# Patient Record
Sex: Female | Born: 1980
Health system: Southern US, Community
[De-identification: ages and names within clinical notes are randomized; demographics above are authoritative.]

## PROBLEM LIST (undated history)

## (undated) ENCOUNTER — Emergency Department (HOSPITAL_COMMUNITY): Admission: EM | Payer: 59 | Source: Home / Self Care

## (undated) DIAGNOSIS — G459 Transient cerebral ischemic attack, unspecified: Secondary | ICD-10-CM

## (undated) HISTORY — DX: Transient cerebral ischemic attack, unspecified: G45.9

---

## 1999-10-23 ENCOUNTER — Encounter: Payer: Self-pay | Admitting: Family Medicine

## 1999-10-23 ENCOUNTER — Encounter: Admission: RE | Admit: 1999-10-23 | Discharge: 1999-10-23 | Payer: Self-pay | Admitting: Family Medicine

## 2000-03-17 ENCOUNTER — Encounter: Payer: Self-pay | Admitting: Family Medicine

## 2000-03-17 ENCOUNTER — Encounter: Admission: RE | Admit: 2000-03-17 | Discharge: 2000-03-17 | Payer: Self-pay | Admitting: Family Medicine

## 2000-12-21 HISTORY — PX: BREAST REDUCTION SURGERY: SHX8

## 2001-02-17 ENCOUNTER — Other Ambulatory Visit: Admission: RE | Admit: 2001-02-17 | Discharge: 2001-02-17 | Payer: Self-pay | Admitting: Gynecology

## 2001-03-31 ENCOUNTER — Other Ambulatory Visit: Admission: RE | Admit: 2001-03-31 | Discharge: 2001-03-31 | Payer: Self-pay | Admitting: Gynecology

## 2002-02-27 ENCOUNTER — Other Ambulatory Visit: Admission: RE | Admit: 2002-02-27 | Discharge: 2002-02-27 | Payer: Self-pay | Admitting: Gynecology

## 2003-03-26 ENCOUNTER — Other Ambulatory Visit: Admission: RE | Admit: 2003-03-26 | Discharge: 2003-03-26 | Payer: Self-pay | Admitting: Gynecology

## 2003-09-27 ENCOUNTER — Other Ambulatory Visit: Admission: RE | Admit: 2003-09-27 | Discharge: 2003-09-27 | Payer: Self-pay | Admitting: Gynecology

## 2004-04-17 ENCOUNTER — Other Ambulatory Visit: Admission: RE | Admit: 2004-04-17 | Discharge: 2004-04-17 | Payer: Self-pay | Admitting: Gynecology

## 2004-09-22 ENCOUNTER — Emergency Department (HOSPITAL_COMMUNITY): Admission: EM | Admit: 2004-09-22 | Discharge: 2004-09-22 | Payer: Self-pay | Admitting: Emergency Medicine

## 2005-04-28 ENCOUNTER — Other Ambulatory Visit: Admission: RE | Admit: 2005-04-28 | Discharge: 2005-04-28 | Payer: Self-pay | Admitting: Gynecology

## 2006-05-03 ENCOUNTER — Other Ambulatory Visit: Admission: RE | Admit: 2006-05-03 | Discharge: 2006-05-03 | Payer: Self-pay | Admitting: Gynecology

## 2006-12-01 ENCOUNTER — Inpatient Hospital Stay (HOSPITAL_COMMUNITY): Admission: AD | Admit: 2006-12-01 | Discharge: 2006-12-01 | Payer: Self-pay | Admitting: Gynecology

## 2007-05-14 ENCOUNTER — Inpatient Hospital Stay (HOSPITAL_COMMUNITY): Admission: AD | Admit: 2007-05-14 | Discharge: 2007-05-14 | Payer: Self-pay | Admitting: Obstetrics and Gynecology

## 2007-05-17 ENCOUNTER — Inpatient Hospital Stay (HOSPITAL_COMMUNITY): Admission: AD | Admit: 2007-05-17 | Discharge: 2007-05-17 | Payer: Self-pay | Admitting: Obstetrics and Gynecology

## 2007-08-02 ENCOUNTER — Inpatient Hospital Stay (HOSPITAL_COMMUNITY): Admission: AD | Admit: 2007-08-02 | Discharge: 2007-08-05 | Payer: Self-pay | Admitting: Obstetrics and Gynecology

## 2011-03-23 ENCOUNTER — Emergency Department (HOSPITAL_COMMUNITY): Payer: 59

## 2011-03-23 ENCOUNTER — Observation Stay (HOSPITAL_COMMUNITY)
Admission: EM | Admit: 2011-03-23 | Discharge: 2011-03-23 | Disposition: A | Discharge status: Home / Self Care | Payer: 59 | Attending: Emergency Medicine | Admitting: Emergency Medicine

## 2011-03-23 DIAGNOSIS — G459 Transient cerebral ischemic attack, unspecified: Principal | ICD-10-CM | POA: Insufficient documentation

## 2011-03-23 DIAGNOSIS — H5310 Unspecified subjective visual disturbances: Secondary | ICD-10-CM

## 2011-03-23 LAB — CBC
HCT: 37.7 % (ref 36.0–46.0)
Hemoglobin: 13.2 g/dL (ref 12.0–15.0)
MCH: 31.4 pg (ref 26.0–34.0)
MCHC: 35 g/dL (ref 30.0–36.0)
MCV: 89.8 fL (ref 78.0–100.0)
Platelets: 216 10*3/uL (ref 150–400)
RBC: 4.2 MIL/uL (ref 3.87–5.11)
RDW: 12.2 % (ref 11.5–15.5)
WBC: 14.1 10*3/uL — ABNORMAL HIGH (ref 4.0–10.5)

## 2011-03-23 LAB — PROTIME-INR
INR: 1.06 (ref 0.00–1.49)
Prothrombin Time: 14 seconds (ref 11.6–15.2)

## 2011-03-23 LAB — COMPREHENSIVE METABOLIC PANEL
ALT: 17 U/L (ref 0–35)
AST: 20 U/L (ref 0–37)
Albumin: 3.8 g/dL (ref 3.5–5.2)
Alkaline Phosphatase: 41 U/L (ref 39–117)
BUN: 13 mg/dL (ref 6–23)
CO2: 21 mEq/L (ref 19–32)
Calcium: 8.9 mg/dL (ref 8.4–10.5)
Chloride: 107 mEq/L (ref 96–112)
Creatinine, Ser: 0.61 mg/dL (ref 0.4–1.2)
GFR calc Af Amer: >60 mL/min (ref >60)
GFR calc non Af Amer: >60 mL/min (ref >60)
Glucose, Bld: 80 mg/dL (ref 70–99)
Potassium: 3.4 mEq/L — ABNORMAL LOW (ref 3.5–5.1)
Sodium: 138 mEq/L (ref 135–145)
Total Bilirubin: 0.7 mg/dL (ref 0.3–1.2)
Total Protein: 6.9 g/dL (ref 6.0–8.3)

## 2011-03-23 LAB — URINALYSIS, ROUTINE W REFLEX MICROSCOPIC
Bilirubin Urine: NEGATIVE
Glucose, UA: NEGATIVE mg/dL
Hgb urine dipstick: NEGATIVE
Ketones, ur: NEGATIVE mg/dL
Nitrite: NEGATIVE
Protein, ur: NEGATIVE mg/dL
Specific Gravity, Urine: 1.01 (ref 1.005–1.030)
Urobilinogen, UA: 0.2 mg/dL (ref 0.0–1.0)
pH: 6 (ref 5.0–8.0)

## 2011-03-23 LAB — APTT: aPTT: 26 seconds (ref 24–37)

## 2011-03-23 LAB — RAPID URINE DRUG SCREEN, HOSP PERFORMED
Amphetamines: NOT DETECTED
Barbiturates: NOT DETECTED
Benzodiazepines: NOT DETECTED
Cocaine: NOT DETECTED
Opiates: NOT DETECTED
Tetrahydrocannabinol: NOT DETECTED

## 2011-03-23 LAB — ETHANOL: Alcohol, Ethyl (B): <5 mg/dL (ref 0–10)

## 2011-03-23 LAB — POCT CARDIAC MARKERS
CKMB, poc: <1 ng/mL — ABNORMAL LOW (ref 1.0–8.0)
Myoglobin, poc: 30.8 ng/mL (ref 12–200)
Troponin i, poc: <0.05 ng/mL (ref 0.00–0.09)

## 2011-03-23 LAB — HEMOGLOBIN A1C
Hgb A1c MFr Bld: 5.1 % (ref <5.7)
Mean Plasma Glucose: 100 mg/dL (ref <117)

## 2011-03-23 LAB — PREGNANCY, URINE: Preg Test, Ur: NEGATIVE

## 2011-04-08 ENCOUNTER — Other Ambulatory Visit: Payer: Self-pay | Admitting: Diagnostic Neuroimaging

## 2011-04-08 DIAGNOSIS — R51 Headache: Secondary | ICD-10-CM

## 2011-04-08 DIAGNOSIS — H5310 Unspecified subjective visual disturbances: Secondary | ICD-10-CM

## 2011-04-08 DIAGNOSIS — G459 Transient cerebral ischemic attack, unspecified: Secondary | ICD-10-CM

## 2011-04-08 DIAGNOSIS — R259 Unspecified abnormal involuntary movements: Secondary | ICD-10-CM

## 2011-04-10 ENCOUNTER — Encounter (HOSPITAL_BASED_OUTPATIENT_CLINIC_OR_DEPARTMENT_OTHER): Payer: 59 | Admitting: Oncology

## 2011-04-10 ENCOUNTER — Other Ambulatory Visit: Payer: Self-pay | Admitting: Oncology

## 2011-04-10 DIAGNOSIS — I749 Embolism and thrombosis of unspecified artery: Secondary | ICD-10-CM

## 2011-04-10 DIAGNOSIS — D6859 Other primary thrombophilia: Secondary | ICD-10-CM

## 2011-04-13 LAB — PROTEIN S, ANTIGEN, FREE: Protein S Ag, Free: 82 % normal (ref 50–147)

## 2011-04-13 LAB — PROTEIN C ACTIVITY: Protein C Activity: 130 % (ref 75–133)

## 2011-04-13 LAB — PROTEIN S ACTIVITY: Protein S Activity: 72 % (ref 69–129)

## 2011-04-13 LAB — PROTEIN C, TOTAL: Protein C, Total: 88 % (ref 72–160)

## 2011-04-13 LAB — PROTEIN S, TOTAL: Protein S Ag, Total: 78 % (ref 60–150)

## 2011-04-17 ENCOUNTER — Ambulatory Visit
Admission: RE | Admit: 2011-04-17 | Discharge: 2011-04-17 | Disposition: A | Discharge status: Home / Self Care | Payer: 59 | Source: Ambulatory Visit | Attending: Diagnostic Neuroimaging | Admitting: Diagnostic Neuroimaging

## 2011-04-17 DIAGNOSIS — G459 Transient cerebral ischemic attack, unspecified: Secondary | ICD-10-CM

## 2011-04-17 DIAGNOSIS — H5310 Unspecified subjective visual disturbances: Secondary | ICD-10-CM

## 2011-04-17 DIAGNOSIS — R259 Unspecified abnormal involuntary movements: Secondary | ICD-10-CM

## 2011-04-17 DIAGNOSIS — R51 Headache: Secondary | ICD-10-CM

## 2011-04-17 MED ORDER — IOHEXOL 350 MG/ML SOLN
100.0000 mL | Freq: Once | INTRAVENOUS | Status: AC | PRN
  Administered 2011-04-17: 100 mL via INTRAVENOUS

## 2011-05-05 NOTE — Discharge Summary (Signed)
Kristen Gould, Kristen Gould             ACCOUNT NO.:  [id number]   MEDICAL RECORD NO.:  [id number]          PATIENT TYPE:  INP   LOCATION:  9119                          FACILITY:  [facility]   PHYSICIAN:  Kristen Gould, M.D. DATE OF BIRTH:  October 11, 1981   DATE OF ADMISSION:  08/02/2007  DATE OF DISCHARGE:  08/05/2007                               DISCHARGE SUMMARY   DISCHARGE DIAGNOSES:  1. Term pregnancy at 39+ weeks, delivered.  2. Status post low transverse cesarean section with double-layer      closure of the uterus.  3. Nonreassuring fetal heart tracing with fetal intolerance of labor.   DISCHARGE MEDICATIONS:  1. Motrin 600 mg p.o. every 6 hours.  2. Percocet one to two tablets p.o. every 4 hours p.r.n.   DISCHARGE FOLLOWUP:  The patient is to follow up in the office in 2  weeks for her incision check and again in 6 weeks for her full  postpartum exam.   HOSPITAL COURSE:  The patient is a 30 year old G1, P0 who was admitted  at 53 and five-sevenths weeks gestation after she presented to labor and  delivery with rupture of membranes.  She had no significant contractions  and thus was begun on Pitocin augmentation.  Her prenatal care been  complicated by positive group B strep status.  She did have a marginal  placenta previa; however, this resolved on followup scan.  Prenatal labs  are as follows:  A negative, antibody negative, RPR nonreactive, rubella  immune, hepatitis B surface antigen negative, HIV negative, GC negative,  chlamydia negative, group B strep positive, cystic fibrosis negative, 1-  hour Glucola 129.  Past OB history:  None.  Past GYN history:  In 2003  she had colposcopy which was normal.  Past surgical history:  Breast  reduction in 2001.  Past medical history:  Remote history of depression,  on no medications currently.  Allergies:  None.  Medications:  Prenatal  vitamins.   On admission she was afebrile with stable vital signs.  Fetal heart rate  was reactive.   Cervix was 75, 1+, at a -2 station and grossly ruptured.  She had been placed on penicillin for her positive group B strep status  and also on Pitocin.  She progressed throughout the day and eventually  reached 6 cm of dilation.  At this point she began to have intermittent  variable decelerations down to approximately 80 twice.  These lasted 45  minutes in duration and resolved with oxygen, repositioning, and the  discontinuation of her Pitocin.  We were assessing to determine whether  her Pitocin could be restarted and she had a third significant  deceleration to the 80s for approximately 6-7 minutes with very slow  recovery.  At this point the cervix was 90, 6, and a 0 station and the  patient was not near delivery.  The Pitocin was off and contractions  were very mild.  It was felt that since the baby was not tolerating even  labor without significant contractions that it would be best to proceed  with cesarean section, given the nonreassuring fetal tracing  and fetal  intolerance of labor.  Risks and benefits were discussed with the  patient in detail and she agreed to proceed.  She underwent a low  transverse C-section and was delivered of a viable female infant.  There  was a nuchal cord x1.  Weight was 7 pounds 2 ounces, Apgars were 8 and  9, and the remainder of the surgery went without difficulty.  She was  then admitted for routine postoperative care and did quite well.  Postoperative day #1 her hemoglobin was 11.9.  She was working on breast-  feeding and having some difficulty, given her history of a breast  reduction; however, was continuing this with supplementation on  discharge.  Upon discharge she was afebrile  with stable vital signs.  Her fundus was firm.  Her incision was clear  with no erythema, and her staples were removed and Steri-Strips placed.  She was given prescriptions for Motrin and Percocet and advised to  follow up in 2 weeks and instructed on pelvic  rest.      Kristen Gould, M.D.  Electronically Signed     KR/MEDQ  D:  08/05/2007  T:  08/05/2007  Job:  [id number]

## 2011-05-05 NOTE — Op Note (Signed)
Kristen Gould, JERNBERG             ACCOUNT NO.:  [id number]   MEDICAL RECORD NO.:  [id number]          PATIENT TYPE:  INP   LOCATION:  9119                          FACILITY:  [facility]   PHYSICIAN:  Huel Cote, M.D. DATE OF BIRTH:  01-Jul-1981   DATE OF PROCEDURE:  08/02/2007  DATE OF DISCHARGE:                               OPERATIVE REPORT   PREOPERATIVE DIAGNOSIS:  1. Term pregnancy at 39+ weeks.  2. Nonreassuring fetal heart rate tracing with fetal intolerance of      labor.   POSTOPERATIVE DIAGNOSIS:  1. Term pregnancy at 39+ weeks.  2. Nonreassuring fetal heart rate tracing with fetal intolerance of      labor.  3. OP presentation and a nuchal cord x1.   PROCEDURE:  Primary low transverse C-section with double layer closure  of uterus.   SURGEON:  Dr. Huel Cote   ANESTHESIA:  Epidural.   SPECIMENS:  Placenta was sent to L&D after cord blood donation.   ESTIMATED BLOOD LOSS:  800 mL.   IV FLUIDS:  1500 mL.   URINE OUTPUT:  100 mL clear urine.   FINDINGS:  There is a vigorous female infant in vertex OP presentation.  Nuchal cord x1 was reduced at the time of delivery of the head.  Apgars  were 08/09, weight was 7 pounds 2 ounces.  Placenta, ovaries and tubes  were normal.   PROCEDURE:  The patient was taken to the operating room where epidural  anesthesia was found to be adequate by Allis clamp test with a Foley  catheter in place.  She was then prepped and draped normal sterile  fashion in dorsal supine position with a leftward tilt.  A  Pfannenstiel's skin incision was then made through the skin and carried  through to underlying layer of fascia by sharp dissection and Bovie  cautery.  The fascia was then nicked in midline and the incision was  extended laterally with Mayo scissors.  The inferior aspect of the  incision was then grasped with Kocher clamps, elevated and dissected off  the underlying rectus muscles. Superior aspect was then elevated and  dissected off the rectus muscles.  The rectus muscles were separated in  midline and the peritoneum was entered bluntly.  Peritoneal incision was  then extended both superiorly, inferiorly with careful attention to  avoid both bowel bladder. The Alexis self-retaining wound retractor was  then placed within the incision and good exposure obtained of the lower  uterine segment.  This was then incised and the bladder flap created and  the remaining tissue incised in transverse fashion and the lower segment  and the cavity itself entered bluntly.  This was then extended bluntly  and the infant's head was delivered atraumatically. Nose and mouth were  bulb suctioned and a nuchal cord was reduced over the infant's head.  The remainder of the body delivered without difficulty.  The cord was  clamped, cut and the infant was handed to the waiting pediatricians.  The placenta was then expressed spontaneously and handed off for cord  blood donation.  The uterus was cleared of all  clots, debris with moist  lap sponge.  The uterine incision was then closed in a routine fashion.  The first layer running locked layer of 0 chromic.  The second an  imbricating layer of the same suture. Good hemostasis was noted.  The  pelvis and gutters were irrigated and cleared of all clots and debris  and no active bleeding was noted.  Therefore all instruments and sponges  were removed from the patient's abdomen.  The subfascial planes were  inspected and found to be normal.  The rectus muscles were  reapproximated with several interrupted mattress sutures of 0 Vicryl and  the fascia was closed with 0 Vicryl in a running fashion and the skin  was then closed with staples.  Sponge, lap and needle counts were  correct and the patient was taken to the recovery room in stable  condition.  The baby was taken to the regular newborn nursery.      Huel Cote, M.D.  Electronically Signed     KR/MEDQ  D:   08/02/2007  T:  08/03/2007  Job:  [id number]

## 2011-10-05 LAB — RH IMMUNE GLOB WKUP(>/=20WKS)(NOT WOMEN'S HOSP): Fetal Screen: NEGATIVE

## 2011-10-05 LAB — RPR: RPR Ser Ql: NONREACTIVE

## 2011-10-05 LAB — CBC
HCT: 35.3 — ABNORMAL LOW
HCT: 37.8
Hemoglobin: 11.9 — ABNORMAL LOW
Hemoglobin: 13.2
MCHC: 33.8
MCHC: 35
MCV: 93.4
MCV: 94.6
Platelets: 156
Platelets: 171
RBC: 3.73 — ABNORMAL LOW
RBC: 4.05
RDW: 12.9
RDW: 13
WBC: 14.6 — ABNORMAL HIGH
WBC: 18.3 — ABNORMAL HIGH

## 2011-10-05 LAB — CCBB MATERNAL DONOR DRAW

## 2014-11-23 ENCOUNTER — Other Ambulatory Visit: Payer: Self-pay

## 2014-11-23 ENCOUNTER — Emergency Department (HOSPITAL_COMMUNITY)
Admission: EM | Admit: 2014-11-23 | Discharge: 2014-11-23 | Disposition: A | Discharge status: Home / Self Care | Payer: Managed Care, Other (non HMO) | Attending: Emergency Medicine | Admitting: Emergency Medicine

## 2014-11-23 ENCOUNTER — Emergency Department (HOSPITAL_COMMUNITY): Payer: Managed Care, Other (non HMO)

## 2014-11-23 ENCOUNTER — Encounter (HOSPITAL_COMMUNITY): Payer: Self-pay

## 2014-11-23 DIAGNOSIS — G44219 Episodic tension-type headache, not intractable: Secondary | ICD-10-CM

## 2014-11-23 DIAGNOSIS — R519 Headache, unspecified: Secondary | ICD-10-CM

## 2014-11-23 DIAGNOSIS — Z87891 Personal history of nicotine dependence: Secondary | ICD-10-CM | POA: Diagnosis not present

## 2014-11-23 DIAGNOSIS — R51 Headache: Secondary | ICD-10-CM

## 2014-11-23 DIAGNOSIS — G930 Cerebral cysts: Secondary | ICD-10-CM | POA: Diagnosis not present

## 2014-11-23 DIAGNOSIS — Z7982 Long term (current) use of aspirin: Secondary | ICD-10-CM | POA: Diagnosis not present

## 2014-11-23 DIAGNOSIS — Z3202 Encounter for pregnancy test, result negative: Secondary | ICD-10-CM | POA: Diagnosis not present

## 2014-11-23 DIAGNOSIS — Z8673 Personal history of transient ischemic attack (TIA), and cerebral infarction without residual deficits: Secondary | ICD-10-CM | POA: Insufficient documentation

## 2014-11-23 LAB — URINALYSIS, ROUTINE W REFLEX MICROSCOPIC
Bilirubin Urine: NEGATIVE
Glucose, UA: NEGATIVE mg/dL
Ketones, ur: NEGATIVE mg/dL
Leukocytes, UA: NEGATIVE
Nitrite: NEGATIVE
Protein, ur: NEGATIVE mg/dL
Specific Gravity, Urine: 1.009 (ref 1.005–1.030)
Urobilinogen, UA: 0.2 mg/dL (ref 0.0–1.0)
pH: 5.5 (ref 5.0–8.0)

## 2014-11-23 LAB — RAPID URINE DRUG SCREEN, HOSP PERFORMED
Amphetamines: NOT DETECTED
Barbiturates: NOT DETECTED
Benzodiazepines: NOT DETECTED
Cocaine: NOT DETECTED
Opiates: NOT DETECTED
Tetrahydrocannabinol: NOT DETECTED

## 2014-11-23 LAB — COMPREHENSIVE METABOLIC PANEL
ALT: 21 U/L (ref 0–35)
AST: 21 U/L (ref 0–37)
Albumin: 4.4 g/dL (ref 3.5–5.2)
Alkaline Phosphatase: 43 U/L (ref 39–117)
Anion gap: 14 (ref 5–15)
BUN: 13 mg/dL (ref 6–23)
CO2: 23 mEq/L (ref 19–32)
Calcium: 9.4 mg/dL (ref 8.4–10.5)
Chloride: 103 mEq/L (ref 96–112)
Creatinine, Ser: 0.63 mg/dL (ref 0.50–1.10)
GFR calc Af Amer: >90 mL/min (ref >90)
GFR calc non Af Amer: >90 mL/min (ref >90)
Glucose, Bld: 93 mg/dL (ref 70–99)
Potassium: 4 mEq/L (ref 3.7–5.3)
Sodium: 140 mEq/L (ref 137–147)
Total Bilirubin: 0.5 mg/dL (ref 0.3–1.2)
Total Protein: 7.6 g/dL (ref 6.0–8.3)

## 2014-11-23 LAB — CBC
HCT: 38.6 % (ref 36.0–46.0)
Hemoglobin: 13.5 g/dL (ref 12.0–15.0)
MCH: 31.8 pg (ref 26.0–34.0)
MCHC: 35 g/dL (ref 30.0–36.0)
MCV: 90.8 fL (ref 78.0–100.0)
Platelets: 218 10*3/uL (ref 150–400)
RBC: 4.25 MIL/uL (ref 3.87–5.11)
RDW: 12.2 % (ref 11.5–15.5)
WBC: 8.6 10*3/uL (ref 4.0–10.5)

## 2014-11-23 LAB — DIFFERENTIAL
Basophils Absolute: 0 10*3/uL (ref 0.0–0.1)
Basophils Relative: 0 % (ref 0–1)
Eosinophils Absolute: 0 10*3/uL (ref 0.0–0.7)
Eosinophils Relative: 1 % (ref 0–5)
Lymphocytes Relative: 27 % (ref 12–46)
Lymphs Abs: 2.3 10*3/uL (ref 0.7–4.0)
Monocytes Absolute: 0.5 10*3/uL (ref 0.1–1.0)
Monocytes Relative: 6 % (ref 3–12)
Neutro Abs: 5.7 10*3/uL (ref 1.7–7.7)
Neutrophils Relative %: 66 % (ref 43–77)

## 2014-11-23 LAB — POC URINE PREG, ED: Preg Test, Ur: NEGATIVE

## 2014-11-23 LAB — URINE MICROSCOPIC-ADD ON

## 2014-11-23 LAB — APTT: aPTT: 29 seconds (ref 24–37)

## 2014-11-23 LAB — PROTIME-INR
INR: 1.11 (ref 0.00–1.49)
Prothrombin Time: 14.4 seconds (ref 11.6–15.2)

## 2014-11-23 MED ORDER — IBUPROFEN 600 MG PO TABS: 600.0000 mg | ORAL_TABLET | Freq: Four times a day (QID) | ORAL | Status: AC | PRN

## 2014-11-23 MED ORDER — METOCLOPRAMIDE HCL 5 MG/ML IJ SOLN: 10.0000 mg | Freq: Once | INTRAMUSCULAR | Status: DC

## 2014-11-23 MED ORDER — DIPHENHYDRAMINE HCL 50 MG/ML IJ SOLN: 12.5000 mg | Freq: Once | INTRAMUSCULAR | Status: DC

## 2014-11-23 MED ORDER — SODIUM CHLORIDE 0.9 % IV BOLUS (SEPSIS)
1000.0000 mL | Freq: Once | INTRAVENOUS | Status: AC
  Administered 2014-11-23: 1000 mL via INTRAVENOUS

## 2014-11-23 NOTE — ED Notes (Signed)
Pt reports intermittent L head numbness and pain ongoing x 2 weeks.  Pt denies symptoms currently.  Neurologically intact.

## 2014-11-23 NOTE — Consult Note (Signed)
Consult Reason for Consult: headache Referring Physician: Dr Blinda Leatherwood [facility] ED  CC: headache  HPI: Kristen Gould is an 33 y.o. female PMHx of questionable TIA approximately 4 years ago presenting to the ED with headache that has been ongoing intermittent for the past 2-3 weeks. Patient reported that the pain is localized to the left side of the face described as a dull, aching sensation with radiation to the left side of the neck. Patient reported that the pain comes and goes - reported that when the pain occurs it is rather intense (at worse is a 7-8/10). Currently 4 to 5/10. Came to ED today due to prolonged duration of the symptoms.  Reported that she has been having increased nausea today, but denied vomiting. Reported that she has been having increased dizziness this morning with feeling unsteady with walking. This has resolved. No focal motor or sensory changes. No gait instability. Has not taken anything for her headaches. Stated that she does not have history of migraines.    Past Medical History  Diagnosis Date  . TIA (transient ischemic attack)     Past Surgical History  Procedure Laterality Date  . Breast reduction surgery  2002  . Cesarean section  2008    Family History  Problem Relation Age of Onset  . Hypertension Father   . Heart attack Maternal Grandfather 70  . Heart attack Paternal Grandfather 61    Social History:  reports that she quit smoking about 2 years ago. She does not have any smokeless tobacco history on file. She reports that she drinks alcohol. She reports that she does not use illicit drugs.  No Known Allergies  Medications: I have reviewed the patient's current medications. Scheduled:  CT head imaging reviewed and is unremarkable.  ROS: Out of a complete 14 system review, the patient complains of only the following symptoms, and all other reviewed systems are negative. + headache, anxiety  Physical Examination: Filed Vitals:   11/23/14 1445   BP: 113/61  Pulse: 77  Temp:   Resp: 22   Physical Exam  Constitutional: He appears well-developed and well-nourished.  Psych: Affect appropriate to situation Eyes: No scleral injection HENT: No OP obstrucion Head: Normocephalic.  Cardiovascular: Normal rate and regular rhythm.  Respiratory: Effort normal and breath sounds normal.  GI: Soft. Bowel sounds are normal. No distension. There is no tenderness.  Skin: WDI  Neurologic Examination Mental Status: Alert, oriented, thought content appropriate.  Speech fluent without evidence of aphasia.  Able to follow 3 step commands without difficulty. Cranial Nerves: II: funduscopic exam wnl bilaterally, visual fields grossly normal, pupils equal, round, reactive to light and accommodation III,IV, VI: ptosis not present, extra-ocular motions intact bilaterally V,VII: smile symmetric, facial light touch sensation normal bilaterally VIII: hearing normal bilaterally IX,X: gag reflex present XI: trapezius strength/neck flexion strength normal bilaterally XII: tongue strength normal  Motor: Right : Upper extremity    Left:     Upper extremity 5/5 deltoid       5/5 deltoid 5/5 biceps      5/5 biceps  5/5 triceps      5/5 triceps 5/5 hand grip      5/5 hand grip  Lower extremity     Lower extremity 5/5 hip flexor      5/5 hip flexor 5/5 quadricep      5/5 quadriceps  5/5 hamstrings     5/5 hamstrings 5/5 plantar flexion       5/5 plantar flexion 5/5 plantar  extension     5/5 plantar extension Tone and bulk:normal tone throughout; no atrophy noted Sensory: Pinprick and light touch intact throughout, bilaterally Deep Tendon Reflexes: 2+ and symmetric throughout Plantars: Right: downgoing   Left: downgoing Cerebellar: normal finger-to-nose, normal heel-to-shin test Gait: normal gait and station  Laboratory Studies:   Basic Metabolic Panel:  Recent Labs Lab 11/23/14 1410  NA 140  K 4.0  CL 103  CO2 23  GLUCOSE 93  BUN 13   CREATININE 0.63  CALCIUM 9.4    Liver Function Tests:  Recent Labs Lab 11/23/14 1410  AST 21  ALT 21  ALKPHOS 43  BILITOT 0.5  PROT 7.6  ALBUMIN 4.4   No results for input(s): LIPASE, AMYLASE in the last 168 hours. No results for input(s): AMMONIA in the last 168 hours.  CBC:  Recent Labs Lab 11/23/14 1410  WBC 8.6  NEUTROABS 5.7  HGB 13.5  HCT 38.6  MCV 90.8  PLT 218    Cardiac Enzymes: No results for input(s): CKTOTAL, CKMB, CKMBINDEX, TROPONINI in the last 168 hours.  BNP: Invalid input(s): POCBNP  CBG: No results for input(s): GLUCAP in the last 168 hours.  Microbiology: No results found for this or any previous visit.  Coagulation Studies:  Recent Labs  11/23/14 1410  LABPROT 14.4  INR 1.11    Urinalysis: No results for input(s): COLORURINE, LABSPEC, PHURINE, GLUCOSEU, HGBUR, BILIRUBINUR, KETONESUR, PROTEINUR, UROBILINOGEN, NITRITE, LEUKOCYTESUR in the last 168 hours.  Invalid input(s): APPERANCEUR  Lipid Panel:  No results found for: CHOL, TRIG, HDL, CHOLHDL, VLDL, LDLCALC  HgbA1C:  Lab Results  Component Value Date   HGBA1C  03/23/2011    5.1 (NOTE)                                                                       According to the ADA Clinical Practice Recommendations for 2011, when HbA1c is used as a screening test:   >=6.5%   Diagnostic of Diabetes Mellitus           (if abnormal result  is confirmed)  5.7-6.4%   Increased risk of developing Diabetes Mellitus  References:Diagnosis and Classification of Diabetes Mellitus,Diabetes Care,2011,34(Suppl 1):S62-S69 and Standards of Medical Care in         Diabetes - 2011,Diabetes Care,2011,34  (Suppl 1):S11-S61.    Urine Drug Screen:     Component Value Date/Time   LABOPIA NONE DETECTED 03/23/2011 1417   COCAINSCRNUR NONE DETECTED 03/23/2011 1417   LABBENZ NONE DETECTED 03/23/2011 1417   AMPHETMU NONE DETECTED 03/23/2011 1417   THCU NONE DETECTED 03/23/2011 1417   LABBARB   03/23/2011 1417    NONE DETECTED        DRUG SCREEN FOR MEDICAL PURPOSES ONLY.  IF CONFIRMATION IS NEEDED FOR ANY PURPOSE, NOTIFY LAB WITHIN 5 DAYS.        LOWEST DETECTABLE LIMITS FOR URINE DRUG SCREEN Drug Class       Cutoff (ng/mL) Amphetamine      1000 Barbiturate      200 Benzodiazepine   200 Tricyclics       300 Opiates          300 Cocaine          300 THC  50    Alcohol Level: No results for input(s): ETH in the last 168 hours.  Other results:  Imaging: Ct Head (brain) Wo Contrast  11/23/2014   CLINICAL DATA:  Two week history of left-sided headache. Intermittent dizziness and numbness  EXAM: CT HEAD WITHOUT CONTRAST  TECHNIQUE: Contiguous axial images were obtained from the base of the skull through the vertex without intravenous contrast.  COMPARISON:  March 23, 2011  FINDINGS: The ventricles are normal in size and configuration. There is no mass, hemorrhage, extra-axial fluid collection, or midline shift. Gray-white compartments are normal. No acute infarct apparent. The bony calvarium appears intact. The mastoid air cells are clear.  IMPRESSION: Study within normal limits.   Electronically Signed   By: Bretta BangWilliam  Woodruff M.D.   On: 11/23/2014 14:34     Assessment/Plan:  33y/o woman presenting for evaluation of 2-3 weeks of fluctuating headache. Presents today for evaluation due to increased intensity and duration. Currently non-focal exam, appears comfortable. Discussed treatment options. She does not want any IV therapy at this time. Discussed using ibuprofen as needed for outpatient symptomatic relief.  -due to prolonged duration and prior history will check MRI brain. If unremarkable no further workup at this time -ibuprofen as needed for symptomatic relief -can follow up with outpatient neurology if headache persists  Kristen Choeter Ashleyanne Hemmingway, DO [facility] [phone]  If 7pm- 7am, please page neurology on call as listed in AMION. 11/23/2014, 4:06  PM

## 2014-11-23 NOTE — ED Provider Notes (Signed)
7:48 PM [facility], please see their note for full history, physical and decision making until this point. In brief this is a 33 y.o. year old female who presented to the ED tonight with Numbness and Headache     H/O TIA, now with a couple weeks of headache and numbness. Labs adn CT unremarkable. Neuro saw and if MRI normal can d/c.   MRI with enlarging arachnoid cyst. Neurology updated and suggested ibuprofen and outpatient follow up. Will have her follow up with Neurosurgery.   Discharge instructions, including strict return precautions for new or worsening symptoms, given. Patient and/or family verbalized understanding and agreement with the plan as described.   Labs, studies and imaging reviewed by myself and considered in medical decision making if ordered. Imaging interpreted by radiology. Pt was discussed with my attending, Dr. Silverio LayYao.  Labs Reviewed  URINALYSIS, ROUTINE W REFLEX MICROSCOPIC - Abnormal; Notable for the following:    Hgb urine dipstick TRACE (*)    All other components within normal limits  URINE MICROSCOPIC-ADD ON - Abnormal; Notable for the following:    Bacteria, UA FEW (*)    All other components within normal limits  PROTIME-INR  APTT  CBC  DIFFERENTIAL  COMPREHENSIVE METABOLIC PANEL  URINE RAPID DRUG SCREEN (HOSP PERFORMED)  I-STAT TROPOININ, ED  POC URINE PREG, ED     New Prescriptions   IBUPROFEN (ADVIL,MOTRIN) 600 MG TABLET    Take 1 tablet (600 mg total) by mouth every 6 (six) hours as needed.     Marily MemosJason Ezana Hubbert, MD 11/24/14 0126  Richardean Canalavid H Yao, MD 11/24/14 [phone]

## 2014-11-23 NOTE — ED Provider Notes (Signed)
CSN: [id number]     Arrival date & time 11/23/14  1319 History   First MD Initiated Contact with Patient 11/23/14 1418     Chief Complaint  Patient presents with  . Numbness  . Headache     (Consider location/radiation/quality/duration/timing/severity/associated sxs/prior Treatment) The history is provided by the patient. No language interpreter was used.  Kristen Gould is a 33 y/o F with PMHx of TIA approximately 4 years ago presenting to the ED with headache that has been ongoing intermittent for the past 2-3 weeks. Patient reported that the pain is localized to the left side of the face described as a dull, aching sensation with radiation to the left side of the neck. Patient reported that the pain comes and goes - reported that when the pain occurs it is rather intense. Stated that the pain is different today - reported that the pain has been constant today since this morning while at work. Reported that when she moves her neck she has a pulling sensation. Reported that she has been having increased nausea today, but denied vomiting. Reported that she has been having increased dizziness this morning with feeling unsteady with walking. Stated that she does not have history of migraines. Patient reported that she has not been using anything for pain. Denied fall, head injury, syncope, vomiting, fever, chills, neck pain, neck stiffness, blurred vision, sudden loss of vision, confusion, disorientation, difficulty swallowing, chest pain, shortness of breath, difficulty breathing, weakness. Denied birth control or estrogen use. Reported that she is supposed to be taking AS 81 mg daily after her TIA which she has not been doing.  PCP none  Past Medical History  Diagnosis Date  . TIA (transient ischemic attack)    Past Surgical History  Procedure Laterality Date  . Breast reduction surgery  2002  . Cesarean section  2008   Family History  Problem Relation Age of Onset  . Hypertension Father    . Heart attack Maternal Grandfather 70  . Heart attack Paternal Grandfather [id number]   History  Substance Use Topics  . Smoking status: Former Smoker    Quit date: 11/23/2012  . Smokeless tobacco: Not on file  . Alcohol Use: Yes   OB History    No data available     Review of Systems  Constitutional: Negative for fever and chills.  Eyes: Negative for visual disturbance.  Respiratory: Negative for chest tightness and shortness of breath.   Cardiovascular: Negative for chest pain.  Gastrointestinal: Positive for nausea. Negative for vomiting and abdominal pain.  Musculoskeletal: Negative for back pain, neck pain and neck stiffness.  Neurological: Positive for dizziness and headaches. Negative for seizures, speech difficulty, weakness and numbness.      Allergies  Review of patient's allergies indicates no known allergies.  Home Medications   Prior to Admission medications   Medication Sig Start Date End Date Taking? Authorizing Provider  aspirin 81 MG tablet Take 81 mg by mouth daily. Aspirin 81 MG Tablet Delayed Release 1 tablet Once a day    Historical Provider, MD  IBUPROFEN PO Take by mouth. Ibuprofen 200 MG Tablet 2 tablets as needed    Historical Provider, MD   BP 121/74 mmHg  Pulse 82  Temp(Src) 98.4 F (36.9 C) (Oral)  Resp 20  Ht 5\' 3"  (1.6 m)  Wt 110 lb (49.896 kg)  BMI 19.49 kg/m2  SpO2 100%  LMP 11/09/2014 Physical Exam  Constitutional: She is oriented to person, place, and time. She appears  well-developed and well-nourished. No distress.  HENT:  Head: Normocephalic and atraumatic.  Mouth/Throat: Oropharynx is clear and moist. No oropharyngeal exudate.  Negative pain upon palpation to the temporal regions Negative signs of trauma to the skull  Eyes: Conjunctivae and EOM are normal. Pupils are equal, round, and reactive to light. Right eye exhibits no discharge. Left eye exhibits no discharge.  Mild horizontal nystagmus that is resolvable  Neck: Normal  range of motion. Neck supple. No tracheal deviation present.  Negative neck stiffness Negative nuchal rigidity  Negative cervical lymphadenopathy  Negative meningeal signs   Cardiovascular: Normal rate, regular rhythm and normal heart sounds.  Exam reveals no friction rub.   No murmur heard. Pulses:      Radial pulses are 2+ on the right side, and 2+ on the left side.       Dorsalis pedis pulses are 2+ on the right side, and 2+ on the left side.  Pulmonary/Chest: Effort normal and breath sounds normal. No respiratory distress. She has no wheezes. She has no rales.  Musculoskeletal: Normal range of motion.  Full ROM to upper and lower extremities without difficulty noted, negative ataxia noted.  Lymphadenopathy:    She has no cervical adenopathy.  Neurological: She is alert and oriented to person, place, and time. No cranial nerve deficit. She exhibits normal muscle tone. Coordination normal.  Cranial nerves III-XII grossly intact Strength 5+/5+ to upper and lower extremities bilaterally with resistance applied, equal distribution noted Equal grip strength  Sensation intact  Negative saddle paresthesias bilaterally  Negative facial droop Negative slurred speech  Negative aphasia Patient is able to bring finger to nose bilaterally  Negative arm drift Fine motor skills intact Gait proper, proper balance - negative sway, negative drift, negative step-offs Patient follows commands well  Patient responds to questions approrpriately   Skin: Skin is warm and dry. No rash noted. She is not diaphoretic. No erythema.  Psychiatric: She has a normal mood and affect. Her behavior is normal. Thought content normal.  Nursing note and vitals reviewed.   ED Course  Procedures (including critical care time)  Results for orders placed or performed during the hospital encounter of 11/23/14  Protime-INR  Result Value Ref Range   Prothrombin Time 14.4 11.6 - 15.2 seconds   INR 1.11 0.00 - 1.49   APTT  Result Value Ref Range   aPTT 29 24 - 37 seconds  CBC  Result Value Ref Range   WBC 8.6 4.0 - 10.5 K/uL   RBC 4.25 3.87 - 5.11 MIL/uL   Hemoglobin 13.5 12.0 - 15.0 g/dL   HCT 16.1 09.6 - 04.5 %   MCV 90.8 78.0 - 100.0 fL   MCH 31.8 26.0 - 34.0 pg   MCHC 35.0 30.0 - 36.0 g/dL   RDW 40.9 81.1 - 91.4 %   Platelets 218 150 - 400 K/uL  Differential  Result Value Ref Range   Neutrophils Relative % 66 43 - 77 %   Neutro Abs 5.7 1.7 - 7.7 K/uL   Lymphocytes Relative 27 12 - 46 %   Lymphs Abs 2.3 0.7 - 4.0 K/uL   Monocytes Relative 6 3 - 12 %   Monocytes Absolute 0.5 0.1 - 1.0 K/uL   Eosinophils Relative 1 0 - 5 %   Eosinophils Absolute 0.0 0.0 - 0.7 K/uL   Basophils Relative 0 0 - 1 %   Basophils Absolute 0.0 0.0 - 0.1 K/uL  Comprehensive metabolic panel  Result Value Ref Range  Sodium 140 137 - 147 mEq/L   Potassium 4.0 3.7 - 5.3 mEq/L   Chloride 103 96 - 112 mEq/L   CO2 23 19 - 32 mEq/L   Glucose, Bld 93 70 - 99 mg/dL   BUN 13 6 - 23 mg/dL   Creatinine, Ser 1.610.63 0.50 - 1.10 mg/dL   Calcium 9.4 8.4 - 09.610.5 mg/dL   Total Protein 7.6 6.0 - 8.3 g/dL   Albumin 4.4 3.5 - 5.2 g/dL   AST 21 0 - 37 U/L   ALT 21 0 - 35 U/L   Alkaline Phosphatase 43 39 - 117 U/L   Total Bilirubin 0.5 0.3 - 1.2 mg/dL   GFR calc non Af Amer >90 >90 mL/min   GFR calc Af Amer >90 >90 mL/min   Anion gap 14 5 - 15    Labs Review Labs Reviewed  CBC  DIFFERENTIAL  PROTIME-INR  APTT  COMPREHENSIVE METABOLIC PANEL  I-STAT TROPOININ, ED    Imaging Review Ct Head (brain) Wo Contrast  11/23/2014   CLINICAL DATA:  Two week history of left-sided headache. Intermittent dizziness and numbness  EXAM: CT HEAD WITHOUT CONTRAST  TECHNIQUE: Contiguous axial images were obtained from the base of the skull through the vertex without intravenous contrast.  COMPARISON:  March 23, 2011  FINDINGS: The ventricles are normal in size and configuration. There is no mass, hemorrhage, extra-axial fluid collection, or  midline shift. Gray-white compartments are normal. No acute infarct apparent. The bony calvarium appears intact. The mastoid air cells are clear.  IMPRESSION: Study within normal limits.   Electronically Signed   By: Bretta BangWilliam  Woodruff M.D.   On: 11/23/2014 14:34     EKG Interpretation None       Date: 11/23/2014  Rate: 68  Rhythm: normal sinus rhythm  QRS Axis: normal  Intervals: normal  ST/T Wave abnormalities: normal  Conduction Disutrbances:none  Narrative Interpretation:   Old EKG Reviewed: unchanged EKG analyzed and reviewed by this provider and attending physician.   3:43 PM This provider spoke with Dr. Hosie PoissonSumner, Neurology. Discussed case in great detail, labs, imaging, and history. Neurology to come and assess patient.   3:58 PM Neurology at bedside assessing patient. This provider spoke with Dr. Hosie PoissonSumner who recommended MRI to be performed to rule out underlying factors.   4:04 PM This provider spoke with patient regarding MRI. Patient reported that she understands and agrees. Patient reported that at this point in time she does not want the pain medications, stated that she is okay for now. Neurology offered cocktail as well, but reported that the patient refused. Patient agreed to MRI to be performed.   MDM   Final diagnoses:  None    Medications  metoCLOPramide (REGLAN) injection 10 mg (not administered)  diphenhydrAMINE (BENADRYL) injection 12.5 mg (not administered)  sodium chloride 0.9 % bolus 1,000 mL (not administered)   Filed Vitals:   11/23/14 1325 11/23/14 1445  BP: 121/74 113/61  Pulse: 82 77  Temp: 98.4 F (36.9 C)   TempSrc: Oral   Resp: 20 22  Height: 5\' 3"  (1.6 m)   Weight: 110 lb (49.896 kg)   SpO2: 100% 100%   EKG noted normal sinus rhythm with a heart rate of 68 bpm - no changes since previous tracing. I-STAT troponin negative elevation. PT/INR within normal limits. CBC and CMP unremarkable. Urine pregnancy negative. UDS negative. Urinalysis  unremarkable-negative nitrites and leukocytes identified, trace of hemoglobin, RBC count 0-2. CT head negative for acute intracranial abnormalities.  Patient presenting to the ED with headache that has been ongoing for the past 2-3 weeks with worsening pain today and dizziness. Patient has history of TIA and has not been taking ASA daily as recommended. Negative focal neurological deficits noted. Pulses palpable and strong. Doubt meningitis. Suspicion to be complex migraine. Patient has been offered headache cocktail by both this provider and Neurology - patient declines. Patient seen and assessed by neurology who recommended patient to have MRI of brain performed - if negative no further work-up recommended.  Discussed case in great detail with Dr. Cheri Rous. Transfer of care to Dr. Cheri Rous at change in shift.   Raymon Mutton, PA-C 11/23/14 1707  Kristen Heatwole, PA-C 11/23/14 1813  Gilda Crease, MD 11/24/14 [phone]

## 2017-09-29 DIAGNOSIS — H35413 Lattice degeneration of retina, bilateral: Secondary | ICD-10-CM | POA: Diagnosis not present

## 2017-09-29 DIAGNOSIS — H35461 Secondary vitreoretinal degeneration, right eye: Secondary | ICD-10-CM | POA: Diagnosis not present

## 2017-10-22 DIAGNOSIS — B351 Tinea unguium: Secondary | ICD-10-CM | POA: Diagnosis not present

## 2017-11-23 DIAGNOSIS — Z1231 Encounter for screening mammogram for malignant neoplasm of breast: Secondary | ICD-10-CM | POA: Diagnosis not present

## 2017-11-23 DIAGNOSIS — Z01419 Encounter for gynecological examination (general) (routine) without abnormal findings: Secondary | ICD-10-CM | POA: Diagnosis not present

## 2018-01-13 DIAGNOSIS — J019 Acute sinusitis, unspecified: Secondary | ICD-10-CM | POA: Diagnosis not present

## 2018-09-14 DIAGNOSIS — J019 Acute sinusitis, unspecified: Secondary | ICD-10-CM | POA: Diagnosis not present

## 2018-11-30 DIAGNOSIS — Z681 Body mass index (BMI) 19 or less, adult: Secondary | ICD-10-CM | POA: Diagnosis not present

## 2018-11-30 DIAGNOSIS — Z130 Encounter for screening for diseases of the blood and blood-forming organs and certain disorders involving the immune mechanism: Secondary | ICD-10-CM | POA: Diagnosis not present

## 2018-11-30 DIAGNOSIS — Z01419 Encounter for gynecological examination (general) (routine) without abnormal findings: Secondary | ICD-10-CM | POA: Diagnosis not present

## 2019-02-15 DIAGNOSIS — J069 Acute upper respiratory infection, unspecified: Secondary | ICD-10-CM | POA: Diagnosis not present

## 2019-09-13 ENCOUNTER — Other Ambulatory Visit: Payer: Self-pay

## 2019-09-13 DIAGNOSIS — Z20822 Contact with and (suspected) exposure to covid-19: Secondary | ICD-10-CM

## 2019-09-15 LAB — NOVEL CORONAVIRUS, NAA: SARS-CoV-2, NAA: DETECTED — AB

## 2020-12-12 ENCOUNTER — Other Ambulatory Visit: Payer: Self-pay | Admitting: Gynecology

## 2020-12-12 DIAGNOSIS — R928 Other abnormal and inconclusive findings on diagnostic imaging of breast: Secondary | ICD-10-CM

## 2020-12-30 ENCOUNTER — Other Ambulatory Visit: Payer: Self-pay | Admitting: Gynecology

## 2020-12-30 ENCOUNTER — Ambulatory Visit
Admission: RE | Admit: 2020-12-30 | Discharge: 2020-12-30 | Disposition: A | Discharge status: Home / Self Care | Payer: Managed Care, Other (non HMO) | Source: Ambulatory Visit | Attending: Gynecology | Admitting: Gynecology

## 2020-12-30 ENCOUNTER — Other Ambulatory Visit: Payer: Self-pay

## 2020-12-30 DIAGNOSIS — R928 Other abnormal and inconclusive findings on diagnostic imaging of breast: Secondary | ICD-10-CM

## 2021-07-08 ENCOUNTER — Other Ambulatory Visit: Payer: Self-pay | Admitting: Gynecology

## 2021-07-08 ENCOUNTER — Ambulatory Visit
Admission: RE | Admit: 2021-07-08 | Discharge: 2021-07-08 | Disposition: A | Discharge status: Home / Self Care | Payer: BC Managed Care – PPO | Source: Ambulatory Visit | Attending: Gynecology | Admitting: Gynecology

## 2021-07-08 ENCOUNTER — Other Ambulatory Visit: Payer: Self-pay

## 2021-07-08 DIAGNOSIS — N6489 Other specified disorders of breast: Secondary | ICD-10-CM

## 2021-07-08 DIAGNOSIS — R928 Other abnormal and inconclusive findings on diagnostic imaging of breast: Secondary | ICD-10-CM

## 2021-07-16 ENCOUNTER — Other Ambulatory Visit: Payer: Self-pay

## 2021-07-16 ENCOUNTER — Ambulatory Visit
Admission: RE | Admit: 2021-07-16 | Discharge: 2021-07-16 | Disposition: A | Discharge status: Home / Self Care | Payer: BC Managed Care – PPO | Source: Ambulatory Visit | Attending: Gynecology | Admitting: Gynecology

## 2021-07-16 DIAGNOSIS — N6489 Other specified disorders of breast: Secondary | ICD-10-CM

## 2022-10-22 IMAGING — MG MM DIGITAL DIAGNOSTIC UNILAT*L* W/ TOMO W/ CAD
8 series · 8 of 24 positions shown · non-contrast
Comparison: Previous exams including most recent diagnostic
mammogram and ultrasound dated 12/30/2020.

CLINICAL DATA: Follow-up for probably benign findings in the LEFT
breast. The possible asymmetry was initially identified on screening
mammogram dated 12/03/2020.

EXAM:
DIGITAL DIAGNOSTIC UNILATERAL LEFT MAMMOGRAM WITH TOMOSYNTHESIS AND
CAD; ULTRASOUND LEFT BREAST LIMITED
TECHNIQUE: Left digital diagnostic mammography and breast tomosynthesis was
performed. The images were evaluated with computer-aided detection.;
Targeted ultrasound examination of the left breast was performed

[L ML synth-2D]
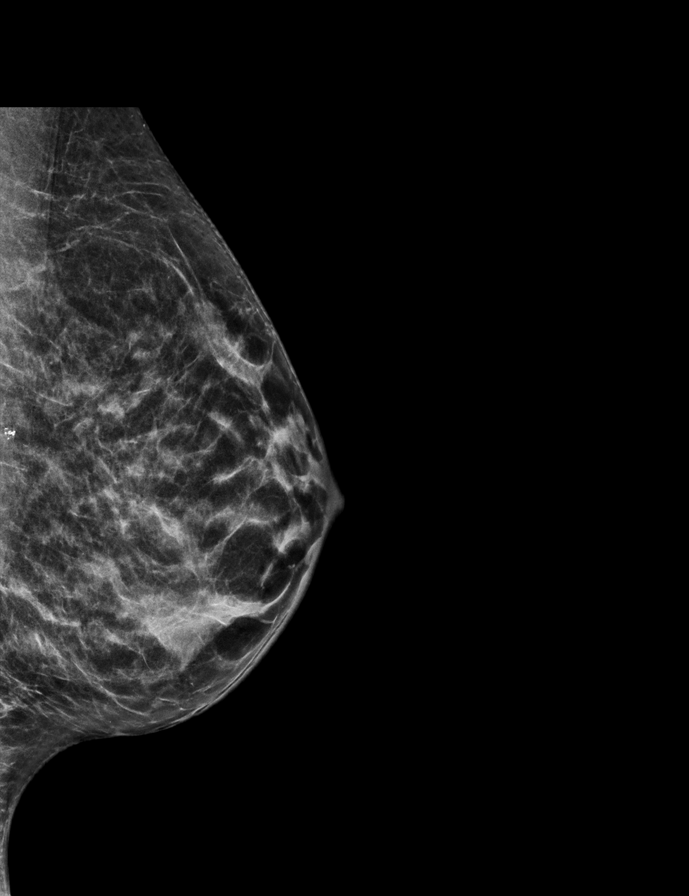

[L CC synth-2D]
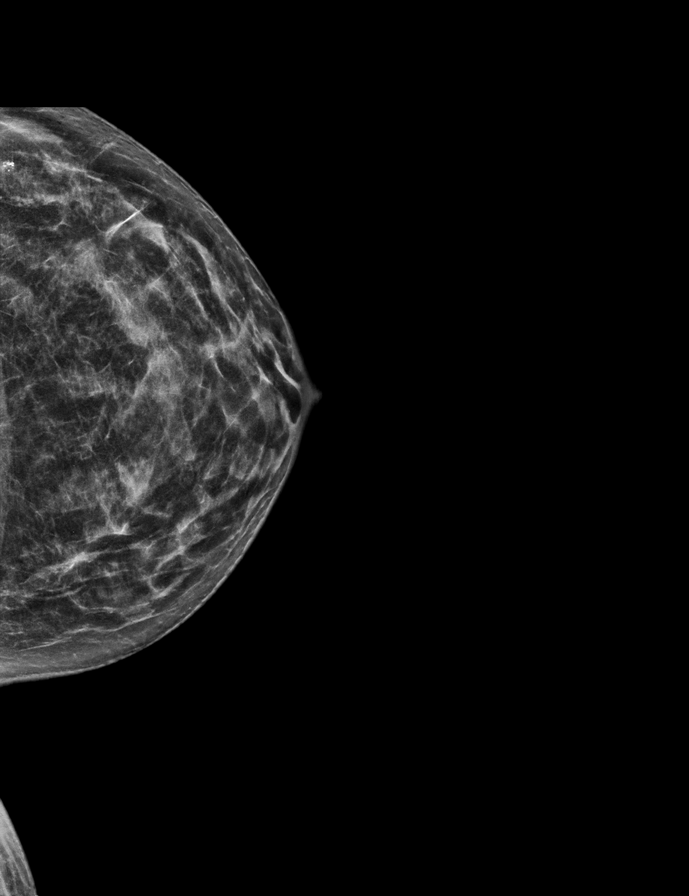

[L MLO synth-2D (1 of 2)]
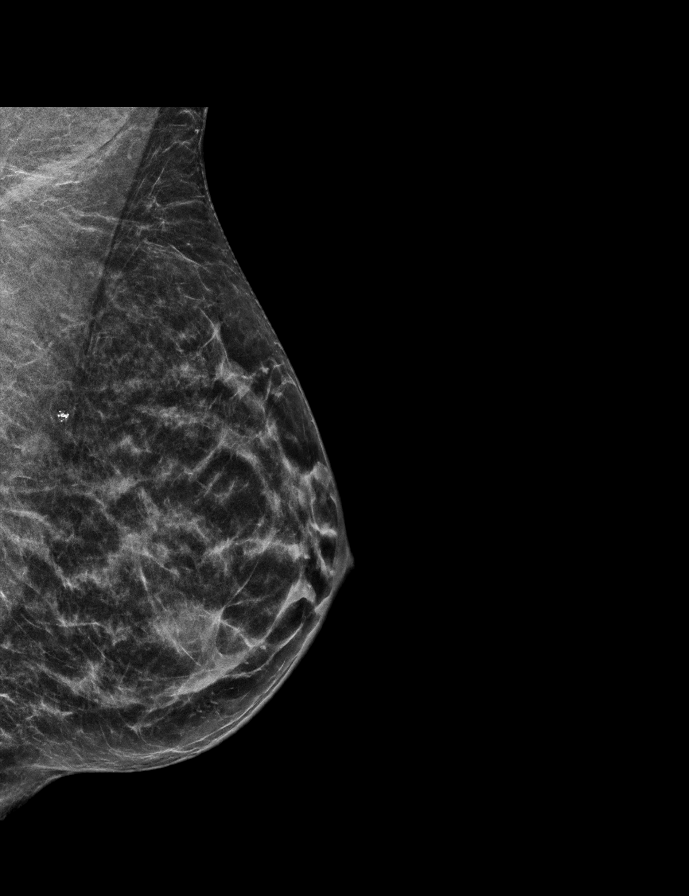

[L MLO synth-2D (2 of 2)]
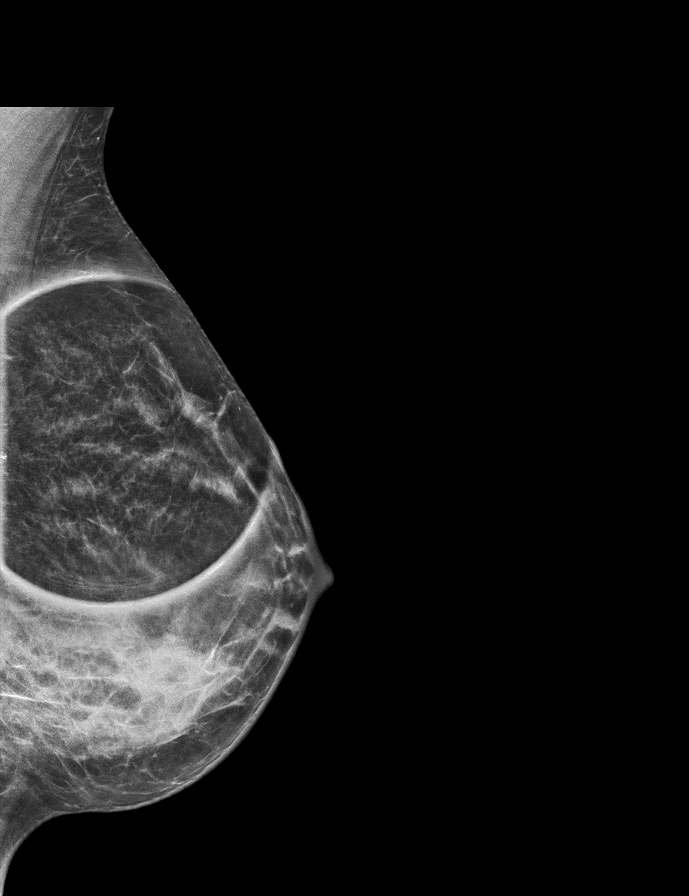

[L ML tomo · tomo slice 25/49.0]
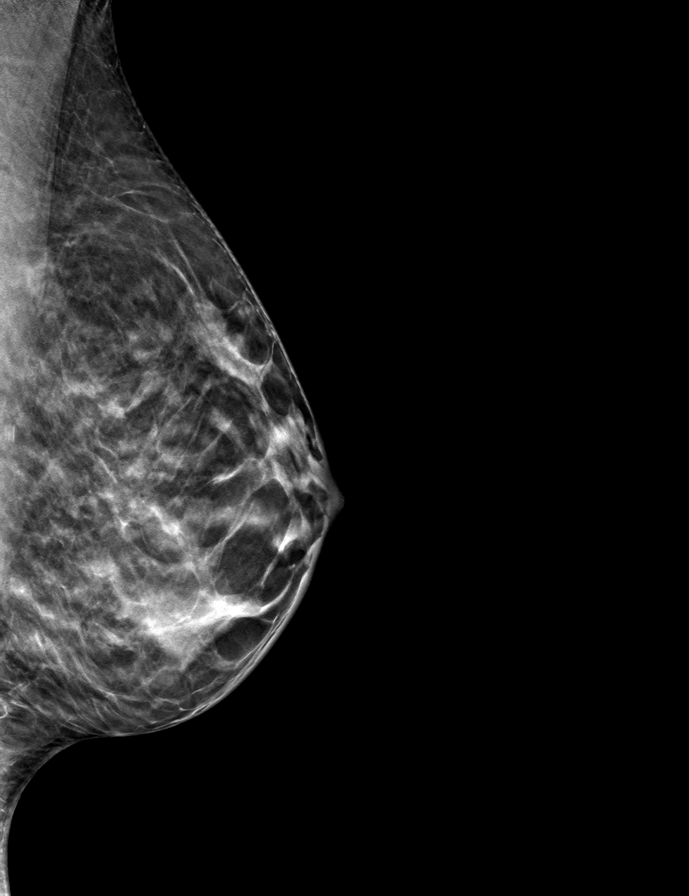

[L CC tomo · tomo slice 25/50.0]
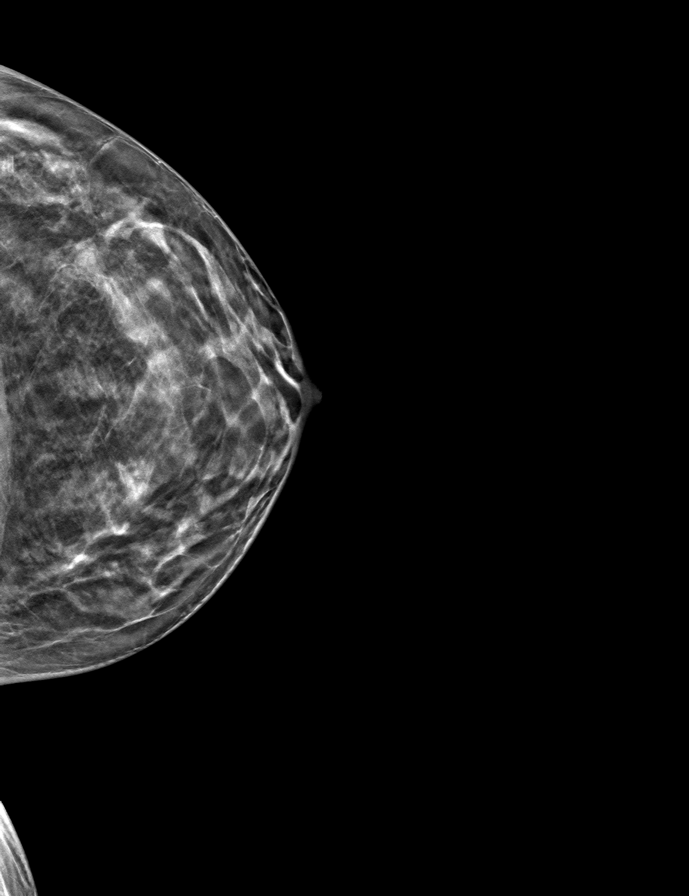

[L MLO tomo (1 of 2) · tomo slice 25/50.0]
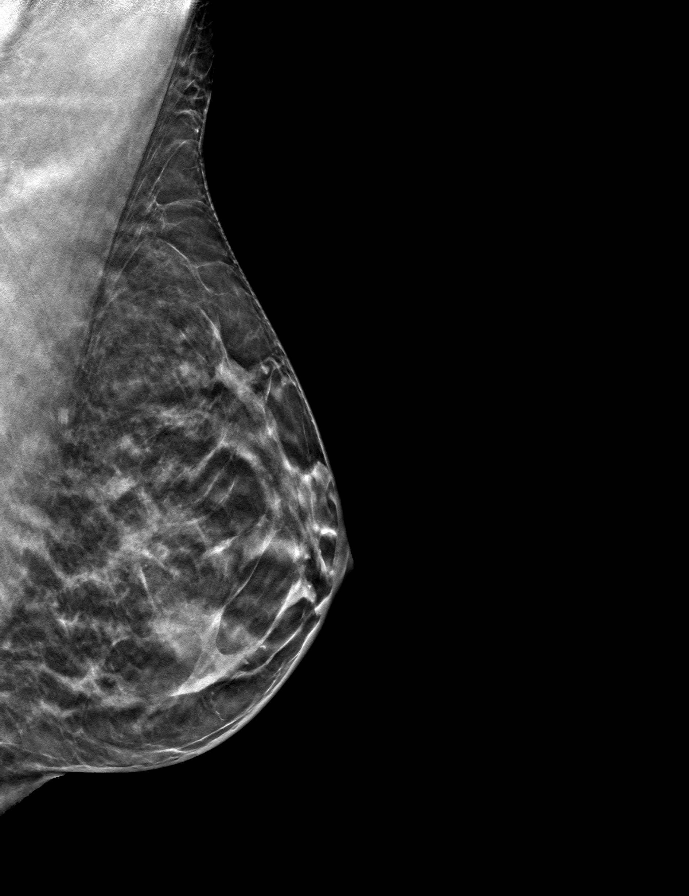

[L MLO tomo (2 of 2) · tomo slice 22/43.0]
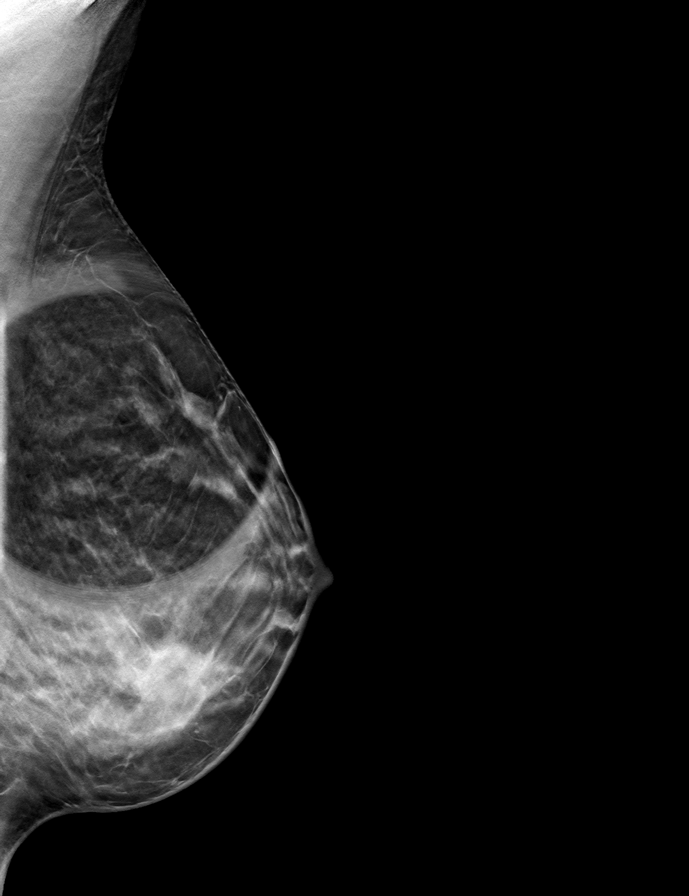

[8 of 24 positions shown; findings below may reference images not displayed]

ACR Breast Density Category b: There are scattered areas of
fibroglandular density.
FINDINGS: LEFT breast diagnostic mammogram: There is a focal asymmetry within
the upper LEFT breast, best seen on MLO views, MLO slice 27 and spot
compression MLO slice 21. No convincing correlate is seen on CC
view.

Targeted ultrasound is performed, evaluating the upper LEFT breast,
showing only normal fibroglandular tissues and fat lobules
throughout. No solid or cystic mass.
IMPRESSION: Focal asymmetry within the upper LEFT breast, best seen on MLO
views, without sonographic correlate. This may represent an island
of normal dense fibroglandular tissue or PASH. Recommend
stereotactic biopsy to exclude malignancy.

RECOMMENDATION:
Stereotactic biopsy, with 3D tomosynthesis guidance, for the focal
asymmetry in the upper LEFT breast.

Stereotactic biopsy is scheduled for [REDACTED].

I have discussed the findings and recommendations with the patient.
If applicable, a reminder letter will be sent to the patient
regarding the next appointment.

BI-RADS CATEGORY  4: Suspicious.
